# Patient Record
Sex: Female | Born: 1970 | Race: Black or African American | Hispanic: No | Smoking: Former smoker
Health system: Southern US, Community
[De-identification: ages and names within clinical notes are randomized; demographics above are authoritative.]

## PROBLEM LIST (undated history)

## (undated) DIAGNOSIS — I10 Essential (primary) hypertension: Secondary | ICD-10-CM

## (undated) HISTORY — PX: CARPAL TUNNEL RELEASE: SHX101

## (undated) HISTORY — PX: BREAST SURGERY: SHX581

---

## 2015-08-15 ENCOUNTER — Emergency Department (HOSPITAL_COMMUNITY)
Admission: EM | Admit: 2015-08-15 | Discharge: 2015-08-15 | Disposition: A | Discharge status: Home / Self Care | Payer: Self-pay | Attending: Emergency Medicine | Admitting: Emergency Medicine

## 2015-08-15 ENCOUNTER — Encounter (HOSPITAL_COMMUNITY): Payer: Self-pay | Admitting: *Deleted

## 2015-08-15 ENCOUNTER — Emergency Department (HOSPITAL_COMMUNITY): Payer: Self-pay

## 2015-08-15 DIAGNOSIS — M1991 Primary osteoarthritis, unspecified site: Secondary | ICD-10-CM | POA: Insufficient documentation

## 2015-08-15 DIAGNOSIS — M25461 Effusion, right knee: Secondary | ICD-10-CM | POA: Insufficient documentation

## 2015-08-15 DIAGNOSIS — M25571 Pain in right ankle and joints of right foot: Secondary | ICD-10-CM | POA: Insufficient documentation

## 2015-08-15 DIAGNOSIS — I10 Essential (primary) hypertension: Secondary | ICD-10-CM | POA: Insufficient documentation

## 2015-08-15 DIAGNOSIS — Z79899 Other long term (current) drug therapy: Secondary | ICD-10-CM | POA: Insufficient documentation

## 2015-08-15 DIAGNOSIS — M150 Primary generalized (osteo)arthritis: Secondary | ICD-10-CM

## 2015-08-15 DIAGNOSIS — Z87891 Personal history of nicotine dependence: Secondary | ICD-10-CM | POA: Insufficient documentation

## 2015-08-15 DIAGNOSIS — M159 Polyosteoarthritis, unspecified: Secondary | ICD-10-CM

## 2015-08-15 DIAGNOSIS — R000 Tachycardia, unspecified: Secondary | ICD-10-CM | POA: Insufficient documentation

## 2015-08-15 HISTORY — DX: Essential (primary) hypertension: I10

## 2015-08-15 MED ORDER — PREDNISONE 10 MG PO TABS: ORAL_TABLET | ORAL | Status: DC

## 2015-08-15 MED ORDER — PROMETHAZINE HCL 12.5 MG PO TABS
12.5000 mg | ORAL_TABLET | Freq: Once | ORAL | Status: AC
  Administered 2015-08-15: 12.5 mg via ORAL
  Filled 2015-08-15: qty 1

## 2015-08-15 MED ORDER — PREDNISONE 50 MG PO TABS
60.0000 mg | ORAL_TABLET | Freq: Once | ORAL | Status: AC
  Administered 2015-08-15: 60 mg via ORAL
  Filled 2015-08-15 (×2): qty 1

## 2015-08-15 MED ORDER — IBUPROFEN 800 MG PO TABS
800.0000 mg | ORAL_TABLET | Freq: Once | ORAL | Status: AC
  Administered 2015-08-15: 800 mg via ORAL
  Filled 2015-08-15: qty 1

## 2015-08-15 NOTE — Discharge Instructions (Signed)
Your xray is consistent with degenerative joint disease (arthritis) at the right knee and ankle area. Please elevate the right lower extremity above your waist. Please use 600mg  of ibuprofen four times daily with food. Use prednisone taper as directed, take with food. Please see Dr. Roda ShuttersXu with [facility], or the orthopedic MD of your choice as soon as possible. Use crutches to decrease pressure and weight on the right lower extremity. Knee Effusion Knee effusion means that you have extra fluid in your knee. This can cause pain. Your knee may be more difficult to bend and move. HOME CARE  Use crutches as told by your doctor.  Wear a knee brace as told by your doctor.  Apply ice to the swollen area:  Put ice in a plastic bag.  Place a towel between your skin and the bag.  Leave the ice on for 20 minutes, 2-3 times per day.  Keep your knee raised (elevated) when you are sitting or lying down.  Take medicines only as told by your doctor.  Do any rehabilitation or strengthening exercises as told by your doctor.  Rest your knee as told by your doctor. You may start doing your normal activities again when your doctor says it is okay.  Keep all follow-up visits as told by your doctor. This is important. GET HELP IF:   You continue to have pain in your knee. GET HELP RIGHT AWAY IF:  You have increased swelling or redness of your knee.  You have severe pain in your knee.  You have a fever.   This information is not intended to replace advice given to you by your health care provider. Make sure you discuss any questions you have with your health care provider.   Document Released: 10/20/2010 Document Revised: 10/08/2014 Document Reviewed: 05/03/2014 Elsevier Interactive Patient Education 2016 [employer]  Arthritis Arthritis is a term that is commonly used to refer to joint pain or joint disease. There are more than 100 types of arthritis. CAUSES The most common cause of  this condition is wear and tear of a joint. Other causes include:  Gout.  Inflammation of a joint.  An infection of a joint.  Sprains and other injuries near the joint.  A drug reaction or allergic reaction. In some cases, the cause may not be known. SYMPTOMS The main symptom of this condition is pain in the joint with movement. Other symptoms include:  Redness, swelling, or stiffness at a joint.  Warmth coming from the joint.  Fever.  Overall feeling of illness. DIAGNOSIS This condition may be diagnosed with a physical exam and tests, including:  Blood tests.  Urine tests.  Imaging tests, such as MRI, X-rays, or a CT scan. Sometimes, fluid is removed from a joint for testing. TREATMENT Treatment for this condition may involve:  Treatment of the cause, if it is known.  Rest.  Raising (elevating) the joint.  Applying cold or hot packs to the joint.  Medicines to improve symptoms and reduce inflammation.  Injections of a steroid such as cortisone into the joint to help reduce pain and inflammation. Depending on the cause of your arthritis, you may need to make lifestyle changes to reduce stress on your joint. These changes may include exercising more and losing weight. HOME CARE INSTRUCTIONS Medicines  Take over-the-counter and prescription medicines only as told by your health care provider.  Do not take aspirin to relieve pain if gout is suspected. Activities  Rest your joint if told by  your health care provider. Rest is important when your disease is active and your joint feels painful, swollen, or stiff.  Avoid activities that make the pain worse. It is important to balance activity with rest.  Exercise your joint regularly with range-of-motion exercises as told by your health care provider. Try doing low-impact exercise, such as:  Swimming.  Water aerobics.  Biking.  Walking. Joint Care  If your joint is swollen, keep it elevated if told by your  health care provider.  If your joint feels stiff in the morning, try taking a warm shower.  If directed, apply heat to the joint. If you have diabetes, do not apply heat without permission from your health care provider.  Put a towel between the joint and the hot pack or heating pad.  Leave the heat on the area for 20-30 minutes.  If directed, apply ice to the joint:  Put ice in a plastic bag.  Place a towel between your skin and the bag.  Leave the ice on for 20 minutes, 2-3 times per day.  Keep all follow-up visits as told by your health care provider. This is important. SEEK MEDICAL CARE IF:  The pain gets worse.  You have a fever. SEEK IMMEDIATE MEDICAL CARE IF:  You develop severe joint pain, swelling, or redness.  Many joints become painful and swollen.  You develop severe back pain.  You develop severe weakness in your leg.  You cannot control your bladder or bowels.   This information is not intended to replace advice given to you by your health care provider. Make sure you discuss any questions you have with your health care provider.   Document Released: 10/25/2004 Document Revised: 06/08/2015 Document Reviewed: 12/13/2014 Elsevier Interactive Patient Education [employer]

## 2015-08-15 NOTE — ED Provider Notes (Signed)
CSN: [id number]     Arrival date & time 08/15/15  1429 History  By signing my name below, I, Jarvis Morganaylor Ferguson, attest that this documentation has been prepared under the direction and in the presence of Ivery QualeHobson Keslie Gritz, PA-C Electronically Signed: Jarvis Morganaylor Ferguson, ED Scribe. 08/15/2015. 4:15 PM.    Chief Complaint  Patient presents with  . Joint Swelling    Patient is a 44 y.o. female presenting with leg pain. The history is provided by the patient. No language interpreter was used.  Leg Pain Location:  Leg, knee and ankle Time since incident:  1 month Injury: no   Leg location:  R leg Knee location:  R knee Ankle location:  R ankle Pain details:    Quality:  Aching   Radiates to:  Does not radiate   Severity:  Mild   Onset quality:  Gradual   Duration:  1 month   Timing:  Intermittent   Progression:  Waxing and waning Chronicity:  Chronic Prior injury to area:  Yes Relieved by:  NSAIDs Worsened by:  Bearing weight Ineffective treatments:  Elevation Associated symptoms: swelling     HPI Comments: Colleen Gay is a 44 y.o. female with a h/o HTN who presents to the Emergency Department complaining of intermittent, moderate, right leg swelling onset 1 month. She states the swelling radiates down into her right ankle. She reports associated pain to the right ankle and leg. She admits she is constantly on her feet for her job and stands on a concrete floor all day. Pt states the pain is exacerbated with standing for long periods of time and bending her right knee. She has been taking Ibuprofen with moderate relief for the pain. Pt notes she has a h/o torn ligament in her right knee for which she saw an orthopedist for and the plan was going to possibly require surgery. She states she has not had an ortho evaluation in several years. She denies any new injury or trauma to the knee. She denies any other complaints at this time.   Past Medical History  Diagnosis Date  . Hypertension     Past Surgical History  Procedure Laterality Date  . Carpal tunnel release    . Breast surgery     History reviewed. No pertinent family history. Social History  Substance Use Topics  . Smoking status: Former Games developermoker  . Smokeless tobacco: None  . Alcohol Use: No   OB History    No data available     Review of Systems  Musculoskeletal: Positive for myalgias and joint swelling.  All other systems reviewed and are negative.     Allergies  Ace inhibitors; Bactrim; Latex; and Shellfish allergy  Home Medications   Prior to Admission medications   Medication Sig Start Date End Date Taking? Authorizing Provider  amLODipine (NORVASC) 10 MG tablet Take 10 mg by mouth daily.   Yes Historical Provider, MD  hydrochlorothiazide (HYDRODIURIL) 25 MG tablet Take 25 mg by mouth daily.   Yes Historical Provider, MD  ibuprofen (ADVIL,MOTRIN) 200 MG tablet Take 600 mg by mouth every 6 (six) hours as needed for moderate pain.   Yes Historical Provider, MD   Triage Vitals: BP 144/98 mmHg  Pulse 93  Temp(Src) 97.4 F (36.3 C) (Oral)  Resp 18  Ht 5\' 5"  (1.651 m)  Wt 250 lb (113.399 kg)  BMI 41.60 kg/m2  SpO2 100%  Physical Exam  Constitutional: She is oriented to person, place, and time. She appears well-developed and  well-nourished. No distress.  HENT:  Head: Normocephalic and atraumatic.  Eyes: Conjunctivae and EOM are normal.  Neck: Neck supple. No tracheal deviation present.  Cardiovascular: Regular rhythm.  Tachycardia present.   Pulses:      Dorsalis pedis pulses are 2+ on the right side.       Posterior tibial pulses are 2+ on the right side.  Capillary refill is less than 3 seconds Mild tachycardia HR 101  Pulmonary/Chest: Effort normal. No respiratory distress.  Musculoskeletal: Normal range of motion. She exhibits no edema.       Right knee: She exhibits swelling.       Left knee: She exhibits swelling.       Right ankle: Achilles tendon normal.  No temperature changes  of lower extremities.  Some puffiness but no pitting of RLE Puffiness of lateral malleolus on right Achilles tendon is intact Negative Homen's No quadriceps deformity bilaterally No deformity of interior tibial tendon Swelling of both right and left knee but no evidence of septic joint  Neurological: She is alert and oriented to person, place, and time.  Skin: Skin is warm and dry.  Psychiatric: She has a normal mood and affect. Her behavior is normal.  Nursing note and vitals reviewed.   ED Course  Procedures (including critical care time)  DIAGNOSTIC STUDIES: Oxygen Saturation is 100% on RA, normal by my interpretation.    COORDINATION OF CARE: 2:57 PM- Will order imaging of right knee and ankle. Pt advised of plan for treatment and pt agrees.  4:30 PM- Recommended orthopedic followup.  Pt advised of plan for treatment and pt agrees.  Labs Review Labs Reviewed - No data to display  Imaging Review Dg Ankle Complete Right  08/15/2015  CLINICAL DATA:  Right ankle pain and swelling for 1 month, no known injury EXAM: RIGHT ANKLE - COMPLETE 3+ VIEW COMPARISON:  None. FINDINGS: Severe diffuse soft tissue swelling. Mortise intact. No acute osseous abnormality. Moderate heel spur. Questionable irregularity overlying the base of the fifth metatarsal on the lateral view likely due to overlying bony structures in light of report of no trauma to suggest fracture. IMPRESSION: Pronounced diffuse nonspecific soft tissue swelling Electronically Signed   By: Esperanza Heir M.D.   On: 08/15/2015 15:27   Dg Knee Complete 4 Views Right  08/15/2015  CLINICAL DATA:  Right knee and ankle pain and swelling for 1 month. EXAM: RIGHT KNEE - COMPLETE 4+ VIEW COMPARISON:  None. FINDINGS: Slight tricompartmental marginal osteophytes. Small joint effusion. No fracture or joint space narrowing. IMPRESSION: Slight tricompartmental arthritis.  Joint effusion. Electronically Signed   By: Francene Boyers M.D.   On:  08/15/2015 15:25   I have personally reviewed and evaluated these images as part of my medical decision-making.   EKG Interpretation None      MDM  Xray of the right knee and ankle reveal advanced DJD and knee effusion. No exam evidence for septic joint, fx, or dislocation. Pt fitted with crutches. ASO for the right ankle. Pt to continue ibuprofen and add prednisone taper. Pt referred to Dr Roda Shutters for orthopedic evaluation.   Final diagnoses:  None    *I have reviewed nursing notes, vital signs, and all appropriate lab and imaging results for this patient.** **I personally performed the services described in this documentation, which was scribed in my presence. The recorded information has been reviewed and is accurate.Ivery Quale, PA-C 08/15/15 1703  Donnetta Hutching, MD 08/15/15 [phone]

## 2015-08-15 NOTE — ED Notes (Signed)
Patient reports right knee and ankle pain/swelling x 1 month. Denies injury.

## 2016-05-29 IMAGING — DX DG KNEE COMPLETE 4+V*R*
4 series · 4 of 4 positions shown · non-contrast
Comparison: None.

CLINICAL DATA: Right knee and ankle pain and swelling for 1 month.

EXAM:
RIGHT KNEE - COMPLETE 4+ VIEW

[knee ap]
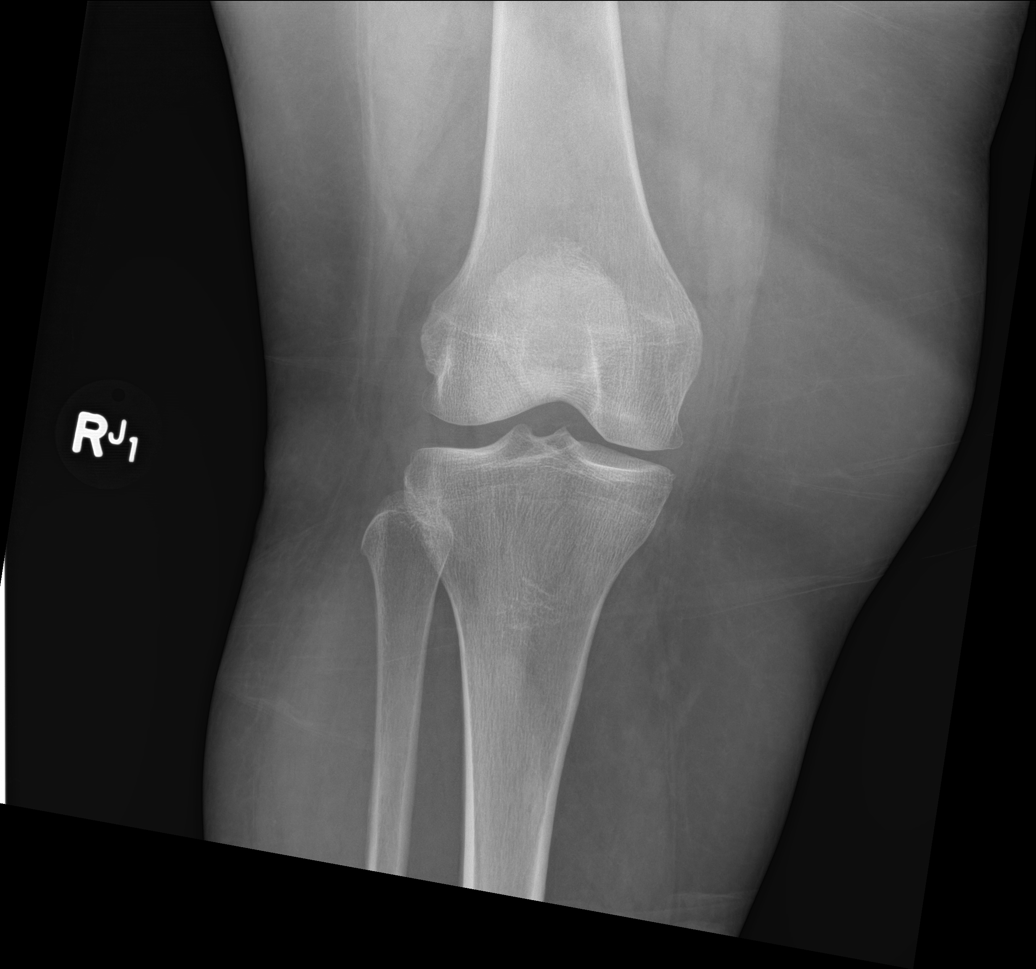

[knee obl (1 of 2)]
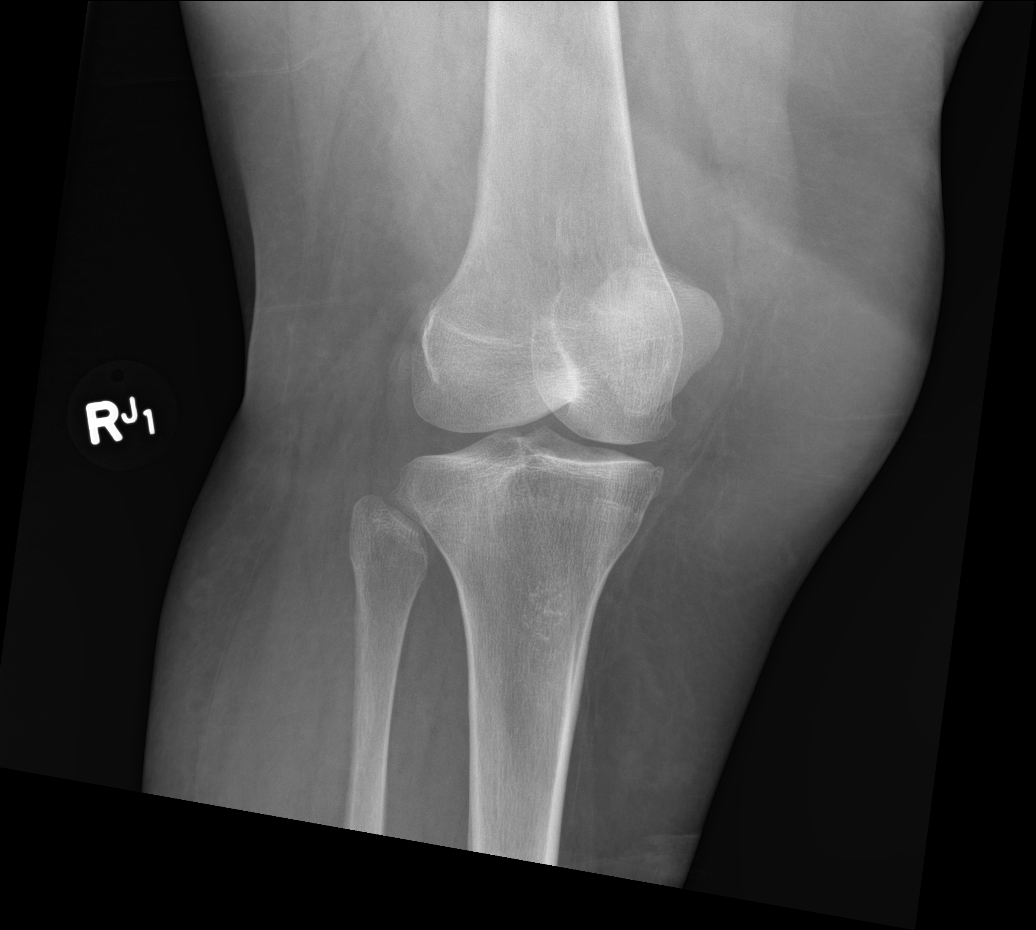

[knee obl (2 of 2)]
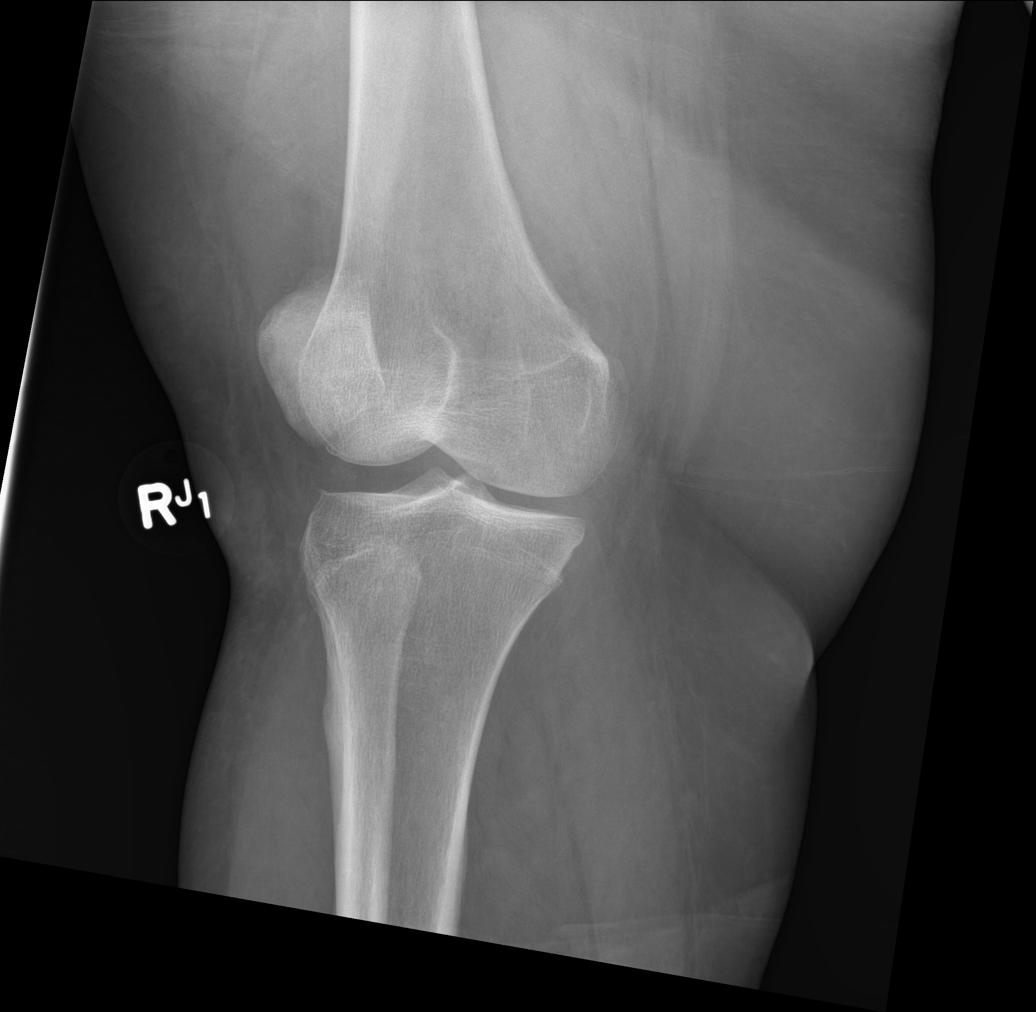

[knee lat]
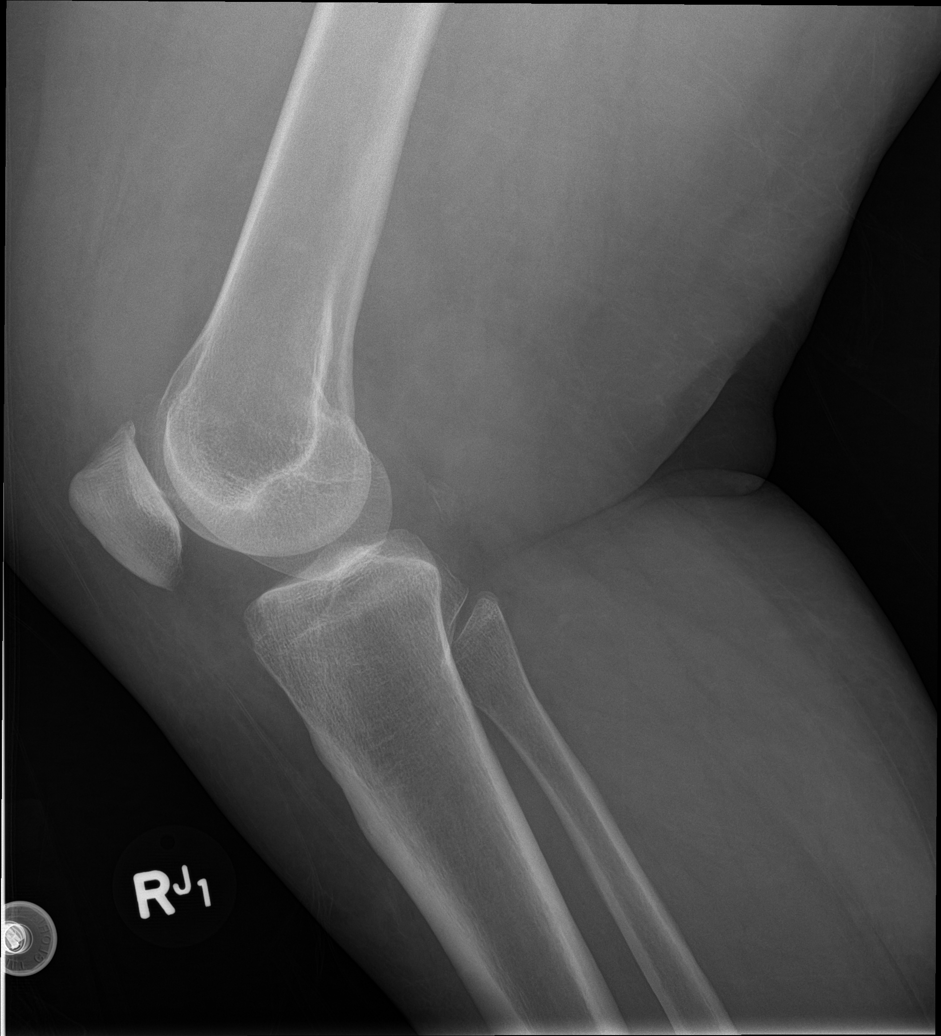

[4 of 4 positions shown; findings below may reference images not displayed]

FINDINGS: Slight tricompartmental marginal osteophytes. Small joint effusion.
No fracture or joint space narrowing.
IMPRESSION: Slight tricompartmental arthritis.  Joint effusion.

## 2022-07-25 ENCOUNTER — Ambulatory Visit (INDEPENDENT_AMBULATORY_CARE_PROVIDER_SITE_OTHER): Payer: Medicaid - Out of State

## 2022-07-25 ENCOUNTER — Ambulatory Visit (INDEPENDENT_AMBULATORY_CARE_PROVIDER_SITE_OTHER): Payer: Medicaid - Out of State | Admitting: Family Medicine

## 2022-07-25 VITALS — BP 122/84 | HR 89 | Ht 65.0 in

## 2022-07-25 DIAGNOSIS — M25562 Pain in left knee: Secondary | ICD-10-CM | POA: Diagnosis not present

## 2022-07-25 DIAGNOSIS — G8929 Other chronic pain: Secondary | ICD-10-CM

## 2022-07-25 DIAGNOSIS — S62101A Fracture of unspecified carpal bone, right wrist, initial encounter for closed fracture: Secondary | ICD-10-CM | POA: Diagnosis not present

## 2022-07-25 DIAGNOSIS — M25561 Pain in right knee: Secondary | ICD-10-CM

## 2022-07-25 NOTE — Progress Notes (Unsigned)
Colleen Gay is a 51 y.o. female who presents to [place] at [facility] today for Possible broken wrist. Patient fell on Monday and had an x ray done yesterday. Patient states that she fell walking across the parking lot and tripped over a twig. Fell on her knees first then on her her right elbow and finally her wrist. Patient has a cast on at the moment and is taking the ibuprofen and prednisone. Bilateral knee pain and swelling since the fall since falling flat on the knees.  She has a history of bilateral knee DJD.  She has had steroid injection left knee a few weeks ago just prior to her fall.  This was working pretty well.   Additionally she reports pain radiating down her left leg.  She reports this has been described to her as a pinched nerve in her back or hip but she is not sure.  Pertinent review of systems: No fevers or chills  Relevant historical information: Hypertension and hyperlipidemia   Exam:  BP 122/84   Pulse 89   Ht 5\' 5"  (1.651 m)   SpO2 98%   BMI 41.60 kg/m  General: Well Developed, well nourished, and in no acute distress.   MSK: Right wrist in well formed volar slab splint  Knees bilaterally mild contusion normal motion with crepitation.   Lab and Radiology Results  07/23/22 7:01 PM XR Wrist Complete 3+ Views Right Molli Knock;  Auth (Verified)  Notes:   (XR Wrist Complete 3+ Views Right) Reason For Exam: Injury   Final Result MEDICAL RECORDS NUMBER: [id number] CMGXR Wrist Complete 3+ Views Right: 3 VIEWS  HISTORY: Injury  COMPARISON: None  FINDINGS/IMPRESSION:  Small ossicle along the dorsum of the wrist, reflecting avulsion fracture, likely triquetral. Overlying soft tissue swelling.  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  Negative ulnar variance. Final   X-ray images bilateral knees obtained today personally and independently  interpreted.  Right knee: Mild to moderate medial compartment DJD.  Mild to moderate patellofemoral DJD.  No acute fractures  Left knee: Moderate to severe medial compartment DJD.  Moderate patellofemoral DJD.  No acute fractures.  Await formal radiology review  Assessment and Plan: 51 y.o. female with right wrist fracture.  Patient fell and injured her right wrist 2 days ago.  She was seen in an outside healthcare facility and has an x-ray report that describes a triquetral avulsion fracture.  However the larger concern would be possible scapholunate disassociation.  Plan to recheck in about 1 week where we can repeat x-ray of the wrist outside of the splint.  If the gap between this scaphoid and the lunate is significant could proceed to MRI arthrogram to further evaluate the degree of disruption and plan for potential hand surgery referral if needed.  As for her bilateral knee pain she has already existing DJD in bilateral knees left worse than right and fell on them.  No fractures are visible but she does have a contusion.  Plan for general conservative management measures including Voltaren gel.  She will try to get recent medical records about her potential lumbar spine or hip MRIs so we can evaluate that further.   PDMP not reviewed this encounter. Orders Placed This Encounter  Procedures   DG Knee AP/LAT W/Sunrise Left    Standing Status:   Future    Number of Occurrences:   1    Standing Expiration Date:  07/26/2023    Order Specific Question:   Reason for Exam (SYMPTOM  OR DIAGNOSIS REQUIRED)    Answer:   eval knee pain    Order Specific Question:   Is patient pregnant?    Answer:   No    Order Specific Question:   Preferred imaging location?    Answer:   [facility]   DG Knee AP/LAT W/Sunrise Right    Standing Status:   Future    Number of Occurrences:   1    Standing Expiration Date:   07/26/2023    Order Specific Question:   Reason for Exam (SYMPTOM  OR  DIAGNOSIS REQUIRED)    Answer:   eval knee pain    Order Specific Question:   Is patient pregnant?    Answer:   No    Order Specific Question:   Preferred imaging location?    Answer:   [facility]   No orders of the defined types were placed in this encounter.    Discussed warning signs or symptoms. Please see discharge instructions. Patient expresses understanding.   The above documentation has been reviewed and is accurate and complete Clementeen Graham, M.D.

## 2022-07-25 NOTE — Patient Instructions (Signed)
Thank you for coming in today.   Please get an Xray today before you leave   Recheck in about 1 week.   We can look at the wrist again.

## 2022-07-26 DIAGNOSIS — I10 Essential (primary) hypertension: Secondary | ICD-10-CM | POA: Insufficient documentation

## 2022-07-26 DIAGNOSIS — E785 Hyperlipidemia, unspecified: Secondary | ICD-10-CM | POA: Insufficient documentation

## 2022-07-30 NOTE — Progress Notes (Signed)
Right knee x-ray shows medium arthritis changes especially under the kneecap.

## 2022-07-30 NOTE — Progress Notes (Signed)
Left knee x-ray shows medium arthritis changes.

## 2022-07-31 NOTE — Progress Notes (Unsigned)
   I, Peterson Lombard, LAT, ATC acting as a scribe for Lynne Leader, MD.  Colleen Gay is a 51 y.o. female who presents to [place] at [facility] today for her 1 wk f/u of a closed fx of the R wrist and bilat knee pain. She was seen in an outside healthcare facility and has an x-ray report that describes a triquetral avulsion fracture, but also concerning for a scapholunate disassociation. Pt was last seen by Dr. Georgina Snell on 07/25/22 and was advised to use Voltaren gel on her knees and to plan for a repeat XR of her R wrist at the time of her f/u visit. Today, pt reports  Dx imaging: 07/25/22 R & L knee XR 07/23/22 R wrist XR  Pertinent review of systems: ***  Relevant historical information: ***   Exam:  LMP  (LMP Unknown)  General: Well Developed, well nourished, and in no acute distress.   MSK: ***    Lab and Radiology Results No results found for this or any previous visit (from the past 72 hour(s)). No results found.     Assessment and Plan: 51 y.o. female with ***   PDMP not reviewed this encounter. No orders of the defined types were placed in this encounter.  No orders of the defined types were placed in this encounter.    Discussed warning signs or symptoms. Please see discharge instructions. Patient expresses understanding.   ***

## 2022-08-01 ENCOUNTER — Ambulatory Visit (INDEPENDENT_AMBULATORY_CARE_PROVIDER_SITE_OTHER): Payer: Medicaid - Out of State

## 2022-08-01 ENCOUNTER — Ambulatory Visit (INDEPENDENT_AMBULATORY_CARE_PROVIDER_SITE_OTHER): Payer: Medicaid - Out of State | Admitting: Family Medicine

## 2022-08-01 VITALS — BP 124/82 | HR 76 | Ht 65.0 in | Wt 257.0 lb

## 2022-08-01 DIAGNOSIS — S62101A Fracture of unspecified carpal bone, right wrist, initial encounter for closed fracture: Secondary | ICD-10-CM

## 2022-08-01 DIAGNOSIS — M25331 Other instability, right wrist: Secondary | ICD-10-CM | POA: Diagnosis not present

## 2022-08-01 NOTE — Patient Instructions (Addendum)
Thank you for coming in today.   Plan for MRI.   Use the cast.   Recheck after the MRI results come back.   This may need surgery.

## 2022-08-06 NOTE — Progress Notes (Signed)
Wrist x-ray shows concern for injury of some ligaments in the wrist.  We are evaluating that with an MRI.

## 2022-08-28 ENCOUNTER — Other Ambulatory Visit: Payer: Medicaid - Out of State

## 2022-09-12 ENCOUNTER — Telehealth: Payer: Self-pay | Admitting: Family Medicine

## 2022-09-12 NOTE — Telephone Encounter (Signed)
Patient called to let Dr Denyse Amass know that she had her MRI done today at [facility] so we should have the report soon.

## 2022-09-14 NOTE — Telephone Encounter (Signed)
Pt returned call, please call with MRI results. She would like to get this info before scheduling.

## 2022-09-14 NOTE — Telephone Encounter (Signed)
MRI report is back.  MRI shows some minimal arthritis changes.  Most importantly there is likely a tear of the scapholunate ligament..  Additionally there is some strains or sprains of some of the ligaments in the wrist.  This may benefit from hand surgery evaluation especially if you have continued pain.  Often for a scapholunate ligament tear will treat with immobilization for a few months.  Recommend return to clinic for repeat evaluation and discussion of MRI results in full detail.   Impression: 1.Moderate degeneration changes without acute fracture 2.  Scapholunate ligament tear. 3.  Question sprain of the dorsal radiocarpal ligament and tear of the palmar radiocarpal ligament.. 4.  TFCC appears intact.  Report will be sent to scan.

## 2022-09-14 NOTE — Telephone Encounter (Signed)
Spoke w/ pt and conveyed Dr. Zollie Pee result note. Pt verbalized understanding. Scheduled f/u visit for 12/27.

## 2022-09-14 NOTE — Telephone Encounter (Signed)
Left pt a VM to call to schedule a f/u visit. If she calls back please schedule. She was informed that I would only be here for a 1/2 day today to go over the MRI results, I will call her back on Monday.

## 2022-09-25 NOTE — Progress Notes (Unsigned)
   I, Philbert Riser, LAT, ATC acting as a scribe for Clementeen Graham, MD.  Colleen Gay is a 51 y.o. female who presents to [facility] today for f/u f/u of a closed fx of the R wrist and MRI arthrogram review. Pt was last seen by Dr. Denyse Amass on 08/01/22 and was placed into a long Exos thumb spica cast and R wrist MRI arthrogram ordered (which was completed at [facility]). Today, pt reports R wrist is still painful and tender.   Additionally she has some pain in the bilateral knees.  She notes that she has had steroid injections in [place] in the past that helped some.  She does have some methocarbamol that she is used which helped a little.  She also has some ibuprofen that she is used which helps some as well.  Dx imaging: R wrist MRI arthrogram 07/23/22 R wrist XR   Pertinent review of systems: No fevers or chills  Relevant historical information: Hypertension and hyperlipidemia   Exam:  BP 136/86   Pulse 90   Ht 5\' 5"  (1.651 m)   Wt 257 lb (116.6 kg)   SpO2 96%   BMI 42.77 kg/m  General: Well Developed, well nourished, and in no acute distress.   MSK: Right wrist in thumb spica Exos cast.  Tender to palpation.    Lab and Radiology Results  Right Wrist MRI arthrogram report from Dec 2023 Impression: 1.Moderate degeneration changes without acute fracture 2.  Scapholunate ligament tear. 3.  Question sprain of the dorsal radiocarpal ligament and tear of the palmar radiocarpal ligament.. 4.  TFCC appears intact.   Report will be sent to scan.     Assessment and Plan: 51 y.o. female with right wrist pain thought to be due to to scapholunate ligament tear and widening of the scapholunate interval and DJD of the wrist.  Unfortunately I do not think this is going to get better much on its own based on the Seen on her x-ray.  I do not have the images to review currently.  Patient did get them on a CD but did not bring them  to the appointment.  I think her wrist is potentially surgical and would benefit from evaluation with a hand surgeon.  Plan to refer to [facility] surgery for further evaluation and potential treatment.  The very least she needs to understand the risks and benefits of surgery recovery time etc.  As for her knee pain plan for trial of meloxicam if not improved consider steroid injection.   PDMP not reviewed this encounter. Orders Placed This Encounter  Procedures   Ambulatory referral to Orthopedic Surgery    Referral Priority:   Routine    Referral Type:   Surgical    Referral Reason:   Specialty Services Required    Requested Specialty:   Orthopedic Surgery    Number of Visits Requested:   1   Meds ordered this encounter  Medications   meloxicam (MOBIC) 15 MG tablet    Sig: Take 1 tablet (15 mg total) by mouth daily.    Dispense:  30 tablet    Refill:  1     Discussed warning signs or symptoms. Please see discharge instructions. Patient expresses understanding.   The above documentation has been reviewed and is accurate and complete 44, M.D.

## 2022-09-26 ENCOUNTER — Ambulatory Visit (INDEPENDENT_AMBULATORY_CARE_PROVIDER_SITE_OTHER): Payer: Medicaid - Out of State | Admitting: Family Medicine

## 2022-09-26 VITALS — BP 136/86 | HR 90 | Ht 65.0 in | Wt 257.0 lb

## 2022-09-26 DIAGNOSIS — G8929 Other chronic pain: Secondary | ICD-10-CM

## 2022-09-26 DIAGNOSIS — M25562 Pain in left knee: Secondary | ICD-10-CM | POA: Diagnosis not present

## 2022-09-26 DIAGNOSIS — M25561 Pain in right knee: Secondary | ICD-10-CM | POA: Diagnosis not present

## 2022-09-26 DIAGNOSIS — M25331 Other instability, right wrist: Secondary | ICD-10-CM | POA: Diagnosis not present

## 2022-09-26 MED ORDER — MELOXICAM 15 MG PO TABS: 15.0000 mg | ORAL_TABLET | Freq: Every day | ORAL | 1 refills | Status: AC

## 2022-09-26 NOTE — Patient Instructions (Addendum)
Thank you for coming in today.   Plan for orthopedic surgery evaluation with [facility] hand surgeon in [facility] your CD with you to the orthopedic surgeon.   Lets try Meloxicam. Do not take with ibuprofen.

## 2022-09-27 ENCOUNTER — Telehealth: Payer: Self-pay | Admitting: Family Medicine

## 2022-09-27 DIAGNOSIS — M25331 Other instability, right wrist: Secondary | ICD-10-CM

## 2022-09-27 NOTE — Telephone Encounter (Signed)
In regards to the referral sent to [facility], they do not accept out of state medicaid. Is there another Orthopedic you would like to send the patient to?

## 2022-09-28 NOTE — Telephone Encounter (Signed)
Left pt a VM informing her. 

## 2022-09-28 NOTE — Telephone Encounter (Signed)
Referral faxed

## 2022-09-28 NOTE — Telephone Encounter (Signed)
It looks like the local offices are not going to accept out-of-state Medicaid.  Will refer you to a hand surgeon in [place]. Rich Brave, M.D.
